# Patient Record
Sex: Male | Born: 1970 | Race: White | Hispanic: No | Marital: Single | Smoking: Never smoker
Health system: Southern US, Community
[De-identification: ages and names within clinical notes are randomized; demographics above are authoritative.]

---

## 2005-05-26 ENCOUNTER — Emergency Department (HOSPITAL_COMMUNITY): Admission: EM | Admit: 2005-05-26 | Discharge: 2005-05-26 | Payer: Self-pay | Admitting: Emergency Medicine

## 2009-05-30 ENCOUNTER — Emergency Department (HOSPITAL_COMMUNITY): Admission: EM | Admit: 2009-05-30 | Discharge: 2009-05-30 | Payer: Self-pay | Admitting: Family Medicine

## 2016-01-01 DIAGNOSIS — K080 Exfoliation of teeth due to systemic causes: Secondary | ICD-10-CM | POA: Diagnosis not present

## 2016-03-28 DIAGNOSIS — K080 Exfoliation of teeth due to systemic causes: Secondary | ICD-10-CM | POA: Diagnosis not present

## 2016-07-09 DIAGNOSIS — F430 Acute stress reaction: Secondary | ICD-10-CM | POA: Diagnosis not present

## 2016-07-09 DIAGNOSIS — J069 Acute upper respiratory infection, unspecified: Secondary | ICD-10-CM | POA: Diagnosis not present

## 2016-09-12 DIAGNOSIS — L259 Unspecified contact dermatitis, unspecified cause: Secondary | ICD-10-CM | POA: Diagnosis not present

## 2016-09-12 DIAGNOSIS — F4320 Adjustment disorder, unspecified: Secondary | ICD-10-CM | POA: Diagnosis not present

## 2016-12-12 DIAGNOSIS — K080 Exfoliation of teeth due to systemic causes: Secondary | ICD-10-CM | POA: Diagnosis not present

## 2016-12-24 DIAGNOSIS — K080 Exfoliation of teeth due to systemic causes: Secondary | ICD-10-CM | POA: Diagnosis not present

## 2017-01-02 DIAGNOSIS — K080 Exfoliation of teeth due to systemic causes: Secondary | ICD-10-CM | POA: Diagnosis not present

## 2017-02-20 DIAGNOSIS — K080 Exfoliation of teeth due to systemic causes: Secondary | ICD-10-CM | POA: Diagnosis not present

## 2017-03-12 DIAGNOSIS — R509 Fever, unspecified: Secondary | ICD-10-CM | POA: Diagnosis not present

## 2017-03-12 DIAGNOSIS — R21 Rash and other nonspecific skin eruption: Secondary | ICD-10-CM | POA: Diagnosis not present

## 2017-03-14 ENCOUNTER — Inpatient Hospital Stay (HOSPITAL_COMMUNITY)
Admission: EM | Admit: 2017-03-14 | Discharge: 2017-03-16 | DRG: 866 | Disposition: A | Discharge status: Home / Self Care | Payer: Federal, State, Local not specified - PPO | Attending: Internal Medicine | Admitting: Internal Medicine

## 2017-03-14 ENCOUNTER — Encounter (HOSPITAL_COMMUNITY): Payer: Self-pay

## 2017-03-14 DIAGNOSIS — B019 Varicella without complication: Secondary | ICD-10-CM | POA: Diagnosis present

## 2017-03-14 DIAGNOSIS — L299 Pruritus, unspecified: Secondary | ICD-10-CM | POA: Diagnosis not present

## 2017-03-14 DIAGNOSIS — E876 Hypokalemia: Secondary | ICD-10-CM | POA: Diagnosis not present

## 2017-03-14 DIAGNOSIS — A419 Sepsis, unspecified organism: Secondary | ICD-10-CM | POA: Diagnosis not present

## 2017-03-14 DIAGNOSIS — R740 Nonspecific elevation of levels of transaminase and lactic acid dehydrogenase [LDH]: Secondary | ICD-10-CM | POA: Diagnosis not present

## 2017-03-14 DIAGNOSIS — R509 Fever, unspecified: Secondary | ICD-10-CM | POA: Diagnosis present

## 2017-03-14 DIAGNOSIS — R21 Rash and other nonspecific skin eruption: Secondary | ICD-10-CM

## 2017-03-14 DIAGNOSIS — R6510 Systemic inflammatory response syndrome (SIRS) of non-infectious origin without acute organ dysfunction: Secondary | ICD-10-CM | POA: Diagnosis not present

## 2017-03-14 DIAGNOSIS — D72820 Lymphocytosis (symptomatic): Secondary | ICD-10-CM | POA: Diagnosis not present

## 2017-03-14 DIAGNOSIS — D696 Thrombocytopenia, unspecified: Secondary | ICD-10-CM | POA: Diagnosis not present

## 2017-03-14 DIAGNOSIS — E860 Dehydration: Secondary | ICD-10-CM | POA: Diagnosis not present

## 2017-03-14 DIAGNOSIS — B2790 Infectious mononucleosis, unspecified without complication: Secondary | ICD-10-CM | POA: Diagnosis present

## 2017-03-14 DIAGNOSIS — B029 Zoster without complications: Secondary | ICD-10-CM | POA: Diagnosis present

## 2017-03-14 DIAGNOSIS — R7401 Elevation of levels of liver transaminase levels: Secondary | ICD-10-CM | POA: Diagnosis present

## 2017-03-14 DIAGNOSIS — F1099 Alcohol use, unspecified with unspecified alcohol-induced disorder: Secondary | ICD-10-CM | POA: Diagnosis not present

## 2017-03-14 DIAGNOSIS — Z8249 Family history of ischemic heart disease and other diseases of the circulatory system: Secondary | ICD-10-CM | POA: Diagnosis not present

## 2017-03-14 DIAGNOSIS — R894 Abnormal immunological findings in specimens from other organs, systems and tissues: Secondary | ICD-10-CM | POA: Diagnosis present

## 2017-03-14 DIAGNOSIS — B0189 Other varicella complications: Secondary | ICD-10-CM | POA: Diagnosis not present

## 2017-03-14 DIAGNOSIS — R768 Other specified abnormal immunological findings in serum: Secondary | ICD-10-CM | POA: Diagnosis present

## 2017-03-14 LAB — CBC WITH DIFFERENTIAL/PLATELET
BASOS ABS: 0.7 10*3/uL — AB (ref 0.0–0.1)
Basophils Relative: 6 %
EOS ABS: 0 10*3/uL (ref 0.0–0.7)
Eosinophils Relative: 0 %
HCT: 44.3 % (ref 39.0–52.0)
Hemoglobin: 15.5 g/dL (ref 13.0–17.0)
LYMPHS ABS: 6 10*3/uL — AB (ref 0.7–4.0)
Lymphocytes Relative: 55 %
MCH: 30.3 pg (ref 26.0–34.0)
MCHC: 35 g/dL (ref 30.0–36.0)
MCV: 86.7 fL (ref 78.0–100.0)
MONO ABS: 0.7 10*3/uL (ref 0.1–1.0)
Monocytes Relative: 6 %
Neutro Abs: 3.6 10*3/uL (ref 1.7–7.7)
Neutrophils Relative %: 33 %
PLATELETS: 131 10*3/uL — AB (ref 150–400)
RBC: 5.11 MIL/uL (ref 4.22–5.81)
RDW: 12.6 % (ref 11.5–15.5)
WBC: 11 10*3/uL — AB (ref 4.0–10.5)

## 2017-03-14 LAB — URINALYSIS, ROUTINE W REFLEX MICROSCOPIC
BACTERIA UA: NONE SEEN
Bilirubin Urine: NEGATIVE
GLUCOSE, UA: NEGATIVE mg/dL
KETONES UR: 20 mg/dL — AB
Leukocytes, UA: NEGATIVE
NITRITE: NEGATIVE
PROTEIN: 30 mg/dL — AB
Specific Gravity, Urine: 1.011 (ref 1.005–1.030)
Squamous Epithelial / LPF: NONE SEEN
pH: 6 (ref 5.0–8.0)

## 2017-03-14 LAB — HEPATIC FUNCTION PANEL
ALT: 129 U/L — AB (ref 17–63)
AST: 52 U/L — ABNORMAL HIGH (ref 15–41)
Albumin: 3.8 g/dL (ref 3.5–5.0)
Alkaline Phosphatase: 114 U/L (ref 38–126)
BILIRUBIN DIRECT: 0.2 mg/dL (ref 0.1–0.5)
BILIRUBIN INDIRECT: 0.4 mg/dL (ref 0.3–0.9)
Total Bilirubin: 0.6 mg/dL (ref 0.3–1.2)
Total Protein: 6.9 g/dL (ref 6.5–8.1)

## 2017-03-14 LAB — BASIC METABOLIC PANEL
Anion gap: 12 (ref 5–15)
BUN: 7 mg/dL (ref 6–20)
CALCIUM: 8.8 mg/dL — AB (ref 8.9–10.3)
CO2: 25 mmol/L (ref 22–32)
CREATININE: 1.17 mg/dL (ref 0.61–1.24)
Chloride: 96 mmol/L — ABNORMAL LOW (ref 101–111)
GFR calc Af Amer: >60 mL/min (ref >60)
Glucose, Bld: 112 mg/dL — ABNORMAL HIGH (ref 65–99)
Potassium: 3.2 mmol/L — ABNORMAL LOW (ref 3.5–5.1)
SODIUM: 133 mmol/L — AB (ref 135–145)

## 2017-03-14 LAB — RAPID HIV SCREEN (HIV 1/2 AB+AG)
HIV 1/2 ANTIBODIES: NONREACTIVE
HIV-1 P24 Antigen - HIV24: NONREACTIVE

## 2017-03-14 LAB — I-STAT CG4 LACTIC ACID, ED: Lactic Acid, Venous: 1.56 mmol/L (ref 0.5–1.9)

## 2017-03-14 LAB — MAGNESIUM: Magnesium: 1.8 mg/dL (ref 1.7–2.4)

## 2017-03-14 LAB — TROPONIN I

## 2017-03-14 MED ORDER — SODIUM CHLORIDE 0.9 % IV BOLUS (SEPSIS)
1000.0000 mL | Freq: Once | INTRAVENOUS | Status: AC
  Administered 2017-03-14: 1000 mL via INTRAVENOUS

## 2017-03-14 MED ORDER — VANCOMYCIN HCL IN DEXTROSE 1-5 GM/200ML-% IV SOLN: 1000.0000 mg | Freq: Three times a day (TID) | INTRAVENOUS | Status: DC

## 2017-03-14 MED ORDER — SODIUM CHLORIDE 0.9 % IV SOLN
1250.0000 mg | Freq: Two times a day (BID) | INTRAVENOUS | Status: DC
  Filled 2017-03-14: qty 1250

## 2017-03-14 MED ORDER — DEXTROSE 5 % IV SOLN
100.0000 mg | Freq: Two times a day (BID) | INTRAVENOUS | Status: DC
  Administered 2017-03-15: 100 mg via INTRAVENOUS
  Filled 2017-03-14 (×2): qty 100

## 2017-03-14 MED ORDER — ACETAMINOPHEN 325 MG PO TABS
325.0000 mg | ORAL_TABLET | Freq: Once | ORAL | Status: DC
  Filled 2017-03-14: qty 1

## 2017-03-14 MED ORDER — ACETAMINOPHEN 500 MG PO TABS
1000.0000 mg | ORAL_TABLET | Freq: Once | ORAL | Status: AC
  Administered 2017-03-14: 1000 mg via ORAL
  Filled 2017-03-14: qty 2

## 2017-03-14 MED ORDER — VANCOMYCIN HCL IN DEXTROSE 1-5 GM/200ML-% IV SOLN
1000.0000 mg | Freq: Once | INTRAVENOUS | Status: AC
  Administered 2017-03-14: 1000 mg via INTRAVENOUS
  Filled 2017-03-14: qty 200

## 2017-03-14 MED ORDER — VANCOMYCIN HCL IN DEXTROSE 1-5 GM/200ML-% IV SOLN
1000.0000 mg | Freq: Once | INTRAVENOUS | Status: DC
  Filled 2017-03-14: qty 200

## 2017-03-14 MED ORDER — POTASSIUM CHLORIDE CRYS ER 20 MEQ PO TBCR
40.0000 meq | EXTENDED_RELEASE_TABLET | ORAL | Status: AC
  Administered 2017-03-14 (×2): 40 meq via ORAL
  Filled 2017-03-14 (×2): qty 2

## 2017-03-14 MED ORDER — DOXYCYCLINE HYCLATE 100 MG IV SOLR
200.0000 mg | Freq: Once | INTRAVENOUS | Status: AC
  Administered 2017-03-14: 200 mg via INTRAVENOUS
  Filled 2017-03-14: qty 200

## 2017-03-14 MED ORDER — PIPERACILLIN-TAZOBACTAM 3.375 G IVPB 30 MIN
3.3750 g | Freq: Once | INTRAVENOUS | Status: AC
  Administered 2017-03-14: 3.375 g via INTRAVENOUS
  Filled 2017-03-14: qty 50

## 2017-03-14 MED ORDER — SODIUM CHLORIDE 0.9 % IV SOLN
INTRAVENOUS | Status: DC
  Administered 2017-03-14 – 2017-03-16 (×4): via INTRAVENOUS

## 2017-03-14 MED ORDER — PIPERACILLIN-TAZOBACTAM 3.375 G IVPB: 3.3750 g | Freq: Three times a day (TID) | INTRAVENOUS | Status: DC

## 2017-03-14 NOTE — ED Provider Notes (Signed)
[facility]-EMERGENCY DEPT Provider Note   CSN: [id number] Arrival date & time: 03/14/17  1147     History   Chief Complaint Chief Complaint  Patient presents with  . Rash    HPI Matthew Logan is a 46 y.o. male presents to the ED with sudden onset, gradually worsening macular papular, pustular generalized rash to entire body 5 days associated with low-grade fever, headache, diarrhea, decreased appetite and fatigue. Rashes mildly pruritic.  [place] during the time his rash started. Patient using calamine lotion, Tylenol, Benadryl with no relief. Patient evaluated by primary care provider and tested negative for varicella, he was referred to ED for work up. No history of chickenpox. Patient denies sexual activity, last over a year ago. Has never been tested for HIV or syphilis. No recent exposure to new topical agents. No new medications. No recent exposure to bath tubs, pools, swimming.  He reports he stayed in a forced her hotel with his friend the whole time, friend does not have any symptoms. No facial or oral angioedema.  No known tick/outdoor exposure.  HPI  History reviewed. No pertinent past medical history.  There are no active problems to display for this patient.   History reviewed. No pertinent surgical history.     Home Medications    Prior to Admission medications   Not on File    Family History No family history on file.  Social History Social History  Substance Use Topics  . Smoking status: Never Smoker  . Smokeless tobacco: Never Used  . Alcohol use Yes     Allergies   Patient has no allergy information on record.   Review of Systems Review of Systems  Constitutional: Positive for appetite change, fatigue and fever. Negative for chills.  HENT: Positive for mouth sores. Negative for congestion and sore throat.   Eyes: Negative for photophobia and visual disturbance.  Respiratory: Negative for cough, chest tightness and shortness of  breath.   Cardiovascular: Negative for chest pain and palpitations.  Gastrointestinal: Positive for vomiting. Negative for abdominal pain, constipation, diarrhea and nausea.  Genitourinary: Negative for difficulty urinating, discharge, dysuria, frequency, hematuria, penile swelling and scrotal swelling.  Musculoskeletal: Negative for arthralgias, joint swelling and myalgias.  Skin: Positive for color change and rash.  Allergic/Immunologic: Negative for immunocompromised state.  Neurological: Positive for headaches. Negative for syncope, weakness, light-headedness and numbness.     Physical Exam Updated Vital Signs BP 135/86   Pulse (!) 102   Temp 99 F (37.2 C) (Oral)   Resp 16   Ht 6\' 2"  (1.88 m)   Wt 99.8 kg (220 lb)   SpO2 96%   BMI 28.25 kg/m   Physical Exam  Constitutional: He is oriented to person, place, and time. He appears well-developed and well-nourished. No distress.  Febrile, 100.7  HENT:  Head: Normocephalic and atraumatic.  Nose: Nose normal.  Moist mucous membranes Vesicular lesions to posterior oropharynx and top of the tongue  Eyes: Conjunctivae and EOM are normal. Pupils are equal, round, and reactive to light.  Neck: Normal range of motion. Neck supple. No JVD present. No tracheal deviation present.  No cervical adenopathy  Cardiovascular: Regular rhythm, normal heart sounds and intact distal pulses.   No murmur heard. Tachycardia, low 100s  Pulmonary/Chest: Effort normal and breath sounds normal. No respiratory distress. He has no wheezes. He has no rales.  Abdominal: Soft. Bowel sounds are normal. He exhibits no distension. There is no tenderness.  Musculoskeletal: Normal  range of motion. He exhibits no deformity.  Neurological: He is alert and oriented to person, place, and time.  Skin: Skin is warm and dry. Capillary refill takes less than 2 seconds. Rash noted.  Diffuse, non-tender, erythematous, maculopapular, pustular rash covering almost  entire  body most densely at face, back and trunk. Rash also to palms of bilateral hands. Erythematous vesicles to posterior oropharynx and top of the tongue.   Psychiatric: He has a normal mood and affect. His behavior is normal. Judgment and thought content normal.  Nursing note and vitals reviewed.    ED Treatments / Results  Labs (all labs ordered are listed, but only abnormal results are displayed) Labs Reviewed  BASIC METABOLIC PANEL - Abnormal; Notable for the following:       Result Value   Sodium 133 (*)    Potassium 3.2 (*)    Chloride 96 (*)    Glucose, Bld 112 (*)    Calcium 8.8 (*)    All other components within normal limits  CBC WITH DIFFERENTIAL/PLATELET - Abnormal; Notable for the following:    WBC 11.0 (*)    Platelets 131 (*)    Lymphs Abs 6.0 (*)    Basophils Absolute 0.7 (*)    All other components within normal limits  HEPATIC FUNCTION PANEL - Abnormal; Notable for the following:    AST 52 (*)    ALT 129 (*)    All other components within normal limits  CULTURE, BLOOD (ROUTINE X 2)  CULTURE, BLOOD (ROUTINE X 2)  RAPID HIV SCREEN (HIV 1/2 AB+AG)  RPR  LYME DISEASE DNA BY PCR(BORRELIA BURG)  HERPES SIMPLEX VIRUS(HSV) DNA BY PCR  ROCKY MTN SPOTTED FVR ABS PNL(IGG+IGM)  URINALYSIS, ROUTINE W REFLEX MICROSCOPIC  TROPONIN I  I-STAT CG4 LACTIC ACID, ED    EKG  EKG Interpretation None       Radiology No results found.  Procedures Procedures (including critical care time)  Medications Ordered in ED Medications  piperacillin-tazobactam (ZOSYN) IVPB 3.375 g (3.375 g Intravenous New Bag/Given 03/14/17 1723)  vancomycin (VANCOCIN) IVPB 1000 mg/200 mL premix (1,000 mg Intravenous New Bag/Given 03/14/17 1722)  sodium chloride 0.9 % bolus 1,000 mL (not administered)  doxycycline (VIBRAMYCIN) 200 mg in dextrose 5 % 250 mL IVPB (not administered)  acetaminophen (TYLENOL) tablet 325 mg (not administered)  sodium chloride 0.9 % bolus 1,000 mL (1,000 mLs Intravenous  New Bag/Given 03/14/17 1723)     Initial Impression / Assessment and Plan / ED Course  I have reviewed the triage vital signs and the nursing notes.  Pertinent labs & imaging results that were available during my care of the patient were reviewed by me and considered in my medical decision making (see chart for details).  Clinical Course as of Apr 07 1231  Fri Mar 14, 2017  1632 Temp 100.7 oral, HR 110  [CG]    Clinical Course User Index [CG] Liberty HandyGibbons, Claudia J, PA-C    46 year old male presents to the ED with slightly pruritic, nontender, erythematous, maculopapular, pustular generalized rash covering most of his body including palms of hands, oropharynx, penis and testicles 5 days associated with fever, fatigue, decreased appetite, diarrhea. On exam patient is febrile and tachycardic, hemodynamically stable. Noncontributory physical exam other than rash noted. No facial or oral angioedema.  No respiratory compromise.  No known recent exposure to possible allergen.  No new topical hygiene products used.  No new medications.  No evidence of animal bite wounds or exposure to ticks/outdoors.  Considering EM, SJS, TEN or meningococcemia although low suspicion for this.  Highly considering HIV, Lyme, RMSF.  Sepsis code activated.  ED lab work so far is remarkable for thrombcytopenia, hypokalemia with potassium 3.2, only mildly elevated ALT 129. Patient was started on IVFs, broad spectrum antibiotics and doxycycline.  Will request admission. Patient and mother at bedside agreeable. Pending RPR, [facility] spotted fever test, Lyme test, HSV test, EKG, troponin, lactic acid, urinalysis and blood cultures.  Patient, ED treatment and discharge plan was discussed with supervising physician who also evaluated the patient and is agreeable with plan.  Final Clinical Impressions(s) / ED Diagnoses   Final diagnoses:  Rash  Sepsis, due to unspecified organism [facility])    New Prescriptions New  Prescriptions   No medications on file     Jerrell Mylar 03/14/17 1718    Liberty Handy, PA-C 03/14/17 1729    Liberty Handy, PA-C 04/06/17 1232    Margarita Grizzle, MD 04/10/17 [phone]

## 2017-03-14 NOTE — Progress Notes (Addendum)
Pharmacy Antibiotic Note  Matthew Logan is a 46 y.o. male admitted on 03/14/2017 with sepsis.  Pharmacy has been consulted for vancomycin and zosyn dosing. Patient also receiving doxycycline per MD for possible tick-borne illness. Patient afebrile, WBC elevated at 11, and LA 1.56. SCr 1.17 for normalized estimated CrCl ~ 80 mL/min.   Plan: Vancomycin 2g IV x1, then 1250 mg IV q12hr  Zosyn 3.375g IV q8hr [facility] and as needed (goal 15-6520mcg/mL) Monitor renal function, clinical picture, and culture data F/u length of therapy   Height: 6\' 2"  (188 cm) Weight: 220 lb (99.8 kg) IBW/kg (Calculated) : 82.2  Temp (24hrs), Avg:99 F (37.2 C), Min:99 F (37.2 C), Max:99 F (37.2 C)   Recent Labs Lab 03/14/17 1314  WBC 11.0*  CREATININE 1.17    Estimated Creatinine Clearance: 102.7 mL/min (by C-G formula based on SCr of 1.17 mg/dL).    Not on File  Antimicrobials this admission: 6/15 Vanc >>  6/15 Zosyn >>  6/15 Doxy x1   Dose adjustments this admission: n/a  Microbiology results: pending   York CeriseKatherine Cook, PharmD Pharmacy Resident  Pager [phone] 03/14/17 5:33 PM

## 2017-03-14 NOTE — H&P (Signed)
History and Physical    NIEVES BARBERI ZOX:[id number] DOB: May 25, 1971 DOA: 03/14/2017  PCP: Patient, No Pcp Per Patient coming from: home  I have personally briefly reviewed patient's old medical records in [facility]  Chief Complaint: Rash  HPI: COLEY KULIKOWSKI is a 46 y.o. male with no significant medical history who presents to ED with a 5 day history of worsening diffuse maculopapular rash. Patient states 5 days prior to admission while at a town in [facility], patient noticed a bump on his nose with some associated weakness decreased appetite and headache. 4 days prior to admission patient flew back and since then up until that of admission patient has noticed a diffuse worsening maculopapular rash on his torso and trunk lower extremities Palms and face. Patient denied any chest pain, no shortness of breath, no abdominal pain, no dysuria, no penile discharge, no constipation, no melena, no hematemesis, no hematochezia, no cough. Patient does endorse chills, fever with a temp as high as 102, nausea. Patient denies any recent new medications. No recent sexual activity. No exposure to hot tubs, pools, swimming. No recent tick bites. Patient states he saw his PCP was given Benadryl and Tylenol and checked for varicella which was negative. Patient states with worsening symptoms PCP office directly patient to the ED.  ED Course: Patient seen in the ED initially noted to have a temperature of 102.9, tachycardic with heart rate of 118, tachypneic with a respiratory rate of 31. comprehensive metabolic profile obtained at a sodium of 133 potassium of 3.2 chloride of 96 calcium of 8.8 AST of 52 ALT of 129 as well as is within normal limits. Lactic acid level is 1.56. CBC had a white count of 11 platelet of 131 absolute lymphocyte count of 6 absolute basophils of 0.7 CBC morphology with atypical lymphocytes. Herpes simplex virus, Lyme disease DNA, [place] spotted fever, RPR, blood cultures, rapid HIV  screen was obtained in the ED. Patient was given a dose of IV vancomycin, IV Zosyn, IV doxycycline.  Review of Systems: As per HPI otherwise 10 point review of systems negative.   History reviewed. No pertinent past medical history.  History reviewed. No pertinent surgical history.   reports that he has never smoked. He has never used smokeless tobacco. He reports that he drinks alcohol. He reports that he does not use drugs.  No Known Allergies  Family History  Problem Relation Age of Onset  . Heart failure Father    Mother alive age 69 and healthy. Father deceased age 25 from heart failure.  Prior to Admission medications   Not on File    Physical Exam: Vitals:   03/14/17 1800 03/14/17 1815 03/14/17 1830 03/14/17 1832  BP: 136/79 (!) 142/80 (!) 143/87   Pulse: 98 (!) 102 96   Resp: (!) 26 19 (!) 31   Temp:    (!) 102.9 F (39.4 C)  TempSrc:    Rectal  SpO2: 93% 92% 92%   Weight:      Height:        Constitutional: NAD, calm, comfortable Vitals:   03/14/17 1800 03/14/17 1815 03/14/17 1830 03/14/17 1832  BP: 136/79 (!) 142/80 (!) 143/87   Pulse: 98 (!) 102 96   Resp: (!) 26 19 (!) 31   Temp:    (!) 102.9 F (39.4 C)  TempSrc:    Rectal  SpO2: 93% 92% 92%   Weight:      Height:  Eyes: PERRL, lids and conjunctivae normal ENMT: Mucous membranes are moist. Posterior pharynx With vesicles noted on the hard palate. Normal dentition.  Neck: normal, supple, no masses, no thyromegaly Respiratory: clear to auscultation bilaterally, no wheezing, no crackles. Normal respiratory effort. No accessory muscle use.  Cardiovascular: Regular rate and rhythm, no murmurs / rubs / gallops. No extremity edema. 2+ pedal pulses. No carotid bruits.  Abdomen: no tenderness, no masses palpated. No hepatosplenomegaly. Bowel sounds positive.  Musculoskeletal: no clubbing / cyanosis. No joint deformity upper and lower extremities. Good ROM, no contractures. Normal muscle tone.  Skin:  Diffuse maculopapular rash, erythematous, pustular covering entire body including face, back, trunk, palms with some vesicular regions. Patient also noted to have some areas with a black necrotic center. Neurologic: CN 2-12 grossly intact. Sensation intact, DTR normal. Strength 5/5 in all 4.  Psychiatric: Normal judgment and insight. Alert and oriented x 3. Normal mood.   Labs on Admission: I have personally reviewed following labs and imaging studies  CBC:  Recent Labs Lab 03/14/17 1314  WBC 11.0*  NEUTROABS 3.6  HGB 15.5  HCT 44.3  MCV 86.7  PLT 131*   Basic Metabolic Panel:  Recent Labs Lab 03/14/17 1314 03/14/17 1710  NA 133*  --   K 3.2*  --   CL 96*  --   CO2 25  --   GLUCOSE 112*  --   BUN 7  --   CREATININE 1.17  --   CALCIUM 8.8*  --   MG  --  1.8   GFR: Estimated Creatinine Clearance: 102.7 mL/min (by C-G formula based on SCr of 1.17 mg/dL). Liver Function Tests:  Recent Labs Lab 03/14/17 1314  AST 52*  ALT 129*  ALKPHOS 114  BILITOT 0.6  PROT 6.9  ALBUMIN 3.8   No results for input(s): LIPASE, AMYLASE in the last 168 hours. No results for input(s): AMMONIA in the last 168 hours. Coagulation Profile: No results for input(s): INR, PROTIME in the last 168 hours. Cardiac Enzymes:  Recent Labs Lab 03/14/17 1710  TROPONINI <0.03   BNP (last 3 results) No results for input(s): PROBNP in the last 8760 hours. HbA1C: No results for input(s): HGBA1C in the last 72 hours. CBG: No results for input(s): GLUCAP in the last 168 hours. Lipid Profile: No results for input(s): CHOL, HDL, LDLCALC, TRIG, CHOLHDL, LDLDIRECT in the last 72 hours. Thyroid Function Tests: No results for input(s): TSH, T4TOTAL, FREET4, T3FREE, THYROIDAB in the last 72 hours. Anemia Panel: No results for input(s): VITAMINB12, FOLATE, FERRITIN, TIBC, IRON, RETICCTPCT in the last 72 hours. Urine analysis: No results found for: COLORURINE, APPEARANCEUR, LABSPEC, PHURINE, GLUCOSEU,  HGBUR, BILIRUBINUR, KETONESUR, PROTEINUR, UROBILINOGEN, NITRITE, LEUKOCYTESUR  Radiological Exams on Admission: No results found.  EKG: Independently reviewed. Sinus tachycardia.  Assessment/Plan Principal Problem:   SIRS (systemic inflammatory response syndrome) (HCC) Active Problems:   Rash   Hypokalemia   Transaminitis   Assessment/plan  #1 systemic inflammatory response syndrome/rash Patient on admission meeting criteria for systemic inflammatory response as noted to have a temperature 102.9, pulse of 118, respirations 31 on admission. Patient noted to have a diffuse rash. Urinalysis pending. Blood cultures obtained are pending. Patient also noted to have a transaminitis as well as a atypical lymphocytes noted on CBC. Concern for viral syndrome. Also concern for acute syphilis versus acute viral etiology versus acute HIV versus [facility] fever. Admit patient to MedSurg. Place on IV fluids. Panculture. Check HIV RNA, EBV panel, CMV IgM,  acute hepatitis panel. Will place empirically on IV doxycycline. Consult with ID for further evaluation and management.  #2 hypokalemia Check a magnesium level. Replete.  #3 transaminitis Questionable etiology. Check an acute hepatitis panel. Follow.   DVT prophylaxis: Lovenox Code Status: Full Family Communication: Updated patient. No family at bedside. Disposition Plan: Home when medically stable and workup completed. Consults called: Infectious disease: Dr Luciana Axe  Admission status: Admit to inpatient.   [facility] MD [facility] Pager [phone]  If 7PM-7AM, please contact night-coverage [email or web address] Password [facility]  03/14/2017, 6:54 PM

## 2017-03-14 NOTE — ED Triage Notes (Signed)
Pt reports rash consisting of red pustules and some with scabs over them to his face, chest, abdomen, back and arms, onset Tuesday. He also reports lack of appetite and fever. He states he tested negative for the chicken pox.

## 2017-03-15 ENCOUNTER — Inpatient Hospital Stay (HOSPITAL_COMMUNITY): Payer: Federal, State, Local not specified - PPO

## 2017-03-15 DIAGNOSIS — F1099 Alcohol use, unspecified with unspecified alcohol-induced disorder: Secondary | ICD-10-CM

## 2017-03-15 DIAGNOSIS — R509 Fever, unspecified: Secondary | ICD-10-CM | POA: Diagnosis present

## 2017-03-15 DIAGNOSIS — Z8249 Family history of ischemic heart disease and other diseases of the circulatory system: Secondary | ICD-10-CM

## 2017-03-15 LAB — COMPREHENSIVE METABOLIC PANEL
ALK PHOS: 99 U/L (ref 38–126)
ALT: 98 U/L — ABNORMAL HIGH (ref 17–63)
AST: 40 U/L (ref 15–41)
Albumin: 3.2 g/dL — ABNORMAL LOW (ref 3.5–5.0)
Anion gap: 7 (ref 5–15)
BUN: 7 mg/dL (ref 6–20)
CALCIUM: 8.3 mg/dL — AB (ref 8.9–10.3)
CHLORIDE: 103 mmol/L (ref 101–111)
CO2: 23 mmol/L (ref 22–32)
Creatinine, Ser: 0.93 mg/dL (ref 0.61–1.24)
GFR calc non Af Amer: >60 mL/min (ref >60)
Glucose, Bld: 132 mg/dL — ABNORMAL HIGH (ref 65–99)
Potassium: 3.4 mmol/L — ABNORMAL LOW (ref 3.5–5.1)
SODIUM: 133 mmol/L — AB (ref 135–145)
Total Bilirubin: 0.7 mg/dL (ref 0.3–1.2)
Total Protein: 6.3 g/dL — ABNORMAL LOW (ref 6.5–8.1)

## 2017-03-15 LAB — CBC
HCT: 40.9 % (ref 39.0–52.0)
Hemoglobin: 13.8 g/dL (ref 13.0–17.0)
MCH: 29.7 pg (ref 26.0–34.0)
MCHC: 33.7 g/dL (ref 30.0–36.0)
MCV: 88 fL (ref 78.0–100.0)
PLATELETS: 125 10*3/uL — AB (ref 150–400)
RBC: 4.65 MIL/uL (ref 4.22–5.81)
RDW: 12.9 % (ref 11.5–15.5)
WBC: 10.6 10*3/uL — ABNORMAL HIGH (ref 4.0–10.5)

## 2017-03-15 LAB — RPR: RPR: NONREACTIVE

## 2017-03-15 LAB — TSH: TSH: 1.125 u[IU]/mL (ref 0.350–4.500)

## 2017-03-15 MED ORDER — FLEET ENEMA 7-19 GM/118ML RE ENEM: 1.0000 | ENEMA | Freq: Once | RECTAL | Status: DC | PRN

## 2017-03-15 MED ORDER — ONDANSETRON HCL 4 MG/2ML IJ SOLN: 4.0000 mg | Freq: Four times a day (QID) | INTRAMUSCULAR | Status: DC | PRN

## 2017-03-15 MED ORDER — POTASSIUM CHLORIDE CRYS ER 20 MEQ PO TBCR
40.0000 meq | EXTENDED_RELEASE_TABLET | Freq: Once | ORAL | Status: AC
  Administered 2017-03-15: 40 meq via ORAL
  Filled 2017-03-15: qty 2

## 2017-03-15 MED ORDER — ACETAMINOPHEN 325 MG PO TABS
650.0000 mg | ORAL_TABLET | Freq: Four times a day (QID) | ORAL | Status: DC | PRN
  Administered 2017-03-15 – 2017-03-16 (×2): 650 mg via ORAL
  Filled 2017-03-15 (×2): qty 2

## 2017-03-15 MED ORDER — ACETAMINOPHEN 650 MG RE SUPP: 650.0000 mg | Freq: Four times a day (QID) | RECTAL | Status: DC | PRN

## 2017-03-15 MED ORDER — ONDANSETRON HCL 4 MG PO TABS: 4.0000 mg | ORAL_TABLET | Freq: Four times a day (QID) | ORAL | Status: DC | PRN

## 2017-03-15 MED ORDER — SORBITOL 70 % SOLN: 30.0000 mL | Freq: Every day | Status: DC | PRN

## 2017-03-15 MED ORDER — ENOXAPARIN SODIUM 40 MG/0.4ML ~~LOC~~ SOLN
40.0000 mg | SUBCUTANEOUS | Status: DC
  Administered 2017-03-15 – 2017-03-16 (×2): 40 mg via SUBCUTANEOUS
  Filled 2017-03-15 (×2): qty 0.4

## 2017-03-15 MED ORDER — POLYETHYLENE GLYCOL 3350 17 G PO PACK: 17.0000 g | PACK | Freq: Every day | ORAL | Status: DC | PRN

## 2017-03-15 NOTE — Consult Note (Addendum)
[facility]       Reason for Consult: rash    Referring Physician: Dr. Janee Mornhompson  Principal Problem:   SIRS (systemic inflammatory response syndrome) (HCC) Active Problems:   Rash   Hypokalemia   Transaminitis   . acetaminophen  325 mg Oral Once  . enoxaparin (LOVENOX) injection  40 mg Subcutaneous Q24H  . potassium chloride  40 mEq Oral Once    Recommendations: Continue supportive care Stop doxycycline  I will order varicella titers  Assessment: He has a rash on his face, trunk, extremities in varying stages and no known history of varicella infection concerning for probable primary varicella.  Other etiologies include acute HIV, syphilis (negative).  Rash not c/w RMSF.     Antibiotics: doxycycline  HPI: Matthew Logan is a 46 y.o. male with no significant pmh [place] and developed a diffuse maculopapular rash.  It is diffuse, non confluent and with crusting lesions as above.  Work up has been negative.  He did get varicella titers by his PCP reportedly which were negative. Some pruritis initially, minimal now.  Some headache, fever.  Denies foreign travel, no sexual contact.  Some dehydration on decreased po intake.  WBC 11, platelets mildly decreased. Some elevation of AST and ALT which has improved today.   CXR independently reviewed and no opacity, clear  Review of Systems:  Constitutional: negative for chills and weight loss Respiratory: negative for cough Gastrointestinal: negative for nausea and diarrhea All other systems reviewed and are negative    PMH: no medical problems or medications  Social History  Substance Use Topics  . Smoking status: Never Smoker  . Smokeless tobacco: Never Used  . Alcohol use Yes    Family History  Problem Relation Age of Onset  . Heart failure Father     No Known Allergies  Physical Exam: Constitutional: in no apparent distress and alert  Vitals:   03/14/17 2108  03/15/17 0438  BP: (!) 139/93 138/76  Pulse: 86 100  Resp:    Temp: 97.6 F (36.4 C) 98.6 F (37 C)   EYES: anicteric ENMT: no thrush Cardiovascular: Cor RRR Respiratory: CTA B; normal respiratory effort GI: Bowel sounds are normal, liver is not enlarged, spleen is not enlarged Musculoskeletal: no pedal edema noted Skin: negatives: no rash Hematologic: + bilateral cervical lad, anterior, no axillary lad  Lab Results  Component Value Date   WBC 10.6 (H) 03/15/2017   HGB 13.8 03/15/2017   HCT 40.9 03/15/2017   MCV 88.0 03/15/2017   PLT 125 (L) 03/15/2017    Lab Results  Component Value Date   CREATININE 0.93 03/15/2017   BUN 7 03/15/2017   NA 133 (L) 03/15/2017   K 3.4 (L) 03/15/2017   CL 103 03/15/2017   CO2 23 03/15/2017    Lab Results  Component Value Date   ALT 98 (H) 03/15/2017   AST 40 03/15/2017   ALKPHOS 99 03/15/2017     Microbiology: Recent Results (from the past 240 hour(s))  Blood Culture (routine x 2)     Status: None (Preliminary result)   Collection Time: 03/14/17  4:55 PM  Result Value Ref Range Status   Specimen Description BLOOD RIGHT ANTECUBITAL  Final   Special Requests   Final    BOTTLES DRAWN AEROBIC AND ANAEROBIC Blood Culture adequate volume   Culture NO GROWTH < 24 HOURS  Final   Report Status PENDING  Incomplete  Blood  Culture (routine x 2)     Status: None (Preliminary result)   Collection Time: 03/14/17  5:10 PM  Result Value Ref Range Status   Specimen Description BLOOD LEFT HAND  Final   Special Requests   Final    BOTTLES DRAWN AEROBIC AND ANAEROBIC Blood Culture adequate volume   Culture NO GROWTH < 24 HOURS  Final   Report Status PENDING  Incomplete    Staci Righter, MD [facility] [email or web address] [phone] pager  [phone] cell 03/15/2017, 1:56 PM

## 2017-03-15 NOTE — Plan of Care (Signed)
PT Screen  Patient Details Name: Matthew HaiJoseph A Axtell MRN: [id number] DOB: 1971-03-02   Screen: Patient reports any weakness that he was feeling had resolved. At baseline he had no mobility issues. He reports no mobility issues at this time. He stood and walked around the room without difficulty. Per nursing he has been getting to the bathroom on his own. His strength is 5/5. The patient has no need for skilled therapy at this time.          Dessie Comaavid J Traver Meckes PT DPT  03/15/2017, 12:35 PM

## 2017-03-15 NOTE — Progress Notes (Signed)
PROGRESS NOTE    Matthew PEMBLETON  FGH:[id number] DOB: 04-12-1971 DOA: 03/14/2017 PCP: Patient, No Pcp Per    Brief Narrative:  Patient is a 46 year old gentleman who presented with the ED with a 5 day history of worsening diffuse rash noted to be in various stages of healing after coming back from last vagus. Patient has been pancultured. Patient also noted to have a fever with a systemic inflammatory response syndrome. Workup underway. ID consulted.   Assessment & Plan:   Principal Problem:   SIRS (systemic inflammatory response syndrome) (HCC) Active Problems:   Rash   Hypokalemia   Transaminitis  #1 systemic inflammatory response syndrome Patient met criteria for systemic inflammatory response syndrome on admission with a temperature 102.9, tachycardia, tachypnea. Patient noted to have a diffuse rash with various stages of healing. Patient pancultured with results pending. Patient noted to have a transaminitis with atypical lymphocytes on CBC. LFTs trending down. Continue IV fluids, supportive care. Patient has been seen in consultation by ID who has ordered some varicella titers. Labs drawn on admission of EBV, CMV, HIV 1 RNA pending. RPR is nonreactive. IV doxycycline has been discontinued per ID recommendations. ID following and appreciate input and recommendations.  #2 rash Questionable etiology. Likely a viral etiology. Patient with diffuse rash in different stages of healing. Patient has been pancultured. [facility] spotted fever, EBV titers, CMV IgM pending, HIV 1 RNA pending, acute hepatitis panel pending. Varicella-zoster antibodies have been ordered per ID. IV antibiotics of doxycycline has been discontinued per ID. IV fluids. Supportive care. Appreciate ID input and recommendations.  #3 hypokalemia Replete.  #4 transaminitis Questionable etiology. Could be secondary to acute illness. LFTs trending down. Acute hepatitis panel pending. Follow.   DVT prophylaxis:  Lovenox Code Status: Full Family Communication: Updated patient and family at bedside. Disposition Plan: Home when clinically improved and labs have resulted with an etiology for patient's rash.   Consultants:   ID: Dr.Comer  Procedures:   Chest x-ray 03/15/2017  Antimicrobials:   IV doxycycline 03/15/2015>>>> 03/16/2015   Subjective: Patient states he's feeling much better than he did on admission. No nausea or emesis. No chest. No shortness of breath. Eating lunch.  Objective: Vitals:   03/14/17 2030 03/14/17 2108 03/15/17 0438 03/15/17 1511  BP: (!) 145/88 (!) 139/93 138/76 (!) 141/81  Pulse: (!) 105 86 100 (!) 108  Resp: (!) 22   16  Temp:  97.6 F (36.4 C) 98.6 F (37 C) 98.8 F (37.1 C)  TempSrc:  Oral Oral Oral  SpO2: 95% 96% 96% 97%  Weight:      Height:        Intake/Output Summary (Last 24 hours) at 03/15/17 1725 Last data filed at 03/15/17 1512  Gross per 24 hour  Intake             3355 ml  Output             1250 ml  Net             2105 ml   Filed Weights   03/14/17 1155  Weight: 99.8 kg (220 lb)    Examination:  General exam: Appears calm and comfortable  Respiratory system: Clear to auscultation. Respiratory effort normal. Cardiovascular system: S1 & S2 heard, RRR. No JVD, murmurs, rubs, gallops or clicks. No pedal edema. Gastrointestinal system: Abdomen is nondistended, soft and nontender. No organomegaly or masses felt. Normal bowel sounds heard. Central nervous system: Alert and oriented. No focal neurological deficits.  Extremities: Symmetric 5 x 5 power. Skin: Rash noted diffusely on trunk face lower extremities Palms in various stages of healing.  Psychiatry: Judgement and insight appear normal. Mood & affect appropriate.     Data Reviewed: I have personally reviewed following labs and imaging studies  CBC:  Recent Labs Lab 03/14/17 1314 03/15/17 0751  WBC 11.0* 10.6*  NEUTROABS 3.6  --   HGB 15.5 13.8  HCT 44.3 40.9  MCV  86.7 88.0  PLT 131* 357*   Basic Metabolic Panel:  Recent Labs Lab 03/14/17 1314 03/14/17 1710 03/15/17 0751  NA 133*  --  133*  K 3.2*  --  3.4*  CL 96*  --  103  CO2 25  --  23  GLUCOSE 112*  --  132*  BUN 7  --  7  CREATININE 1.17  --  0.93  CALCIUM 8.8*  --  8.3*  MG  --  1.8  --    GFR: Estimated Creatinine Clearance: 129.2 mL/min (by C-G formula based on SCr of 0.93 mg/dL). Liver Function Tests:  Recent Labs Lab 03/14/17 1314 03/15/17 0751  AST 52* 40  ALT 129* 98*  ALKPHOS 114 99  BILITOT 0.6 0.7  PROT 6.9 6.3*  ALBUMIN 3.8 3.2*   No results for input(s): LIPASE, AMYLASE in the last 168 hours. No results for input(s): AMMONIA in the last 168 hours. Coagulation Profile: No results for input(s): INR, PROTIME in the last 168 hours. Cardiac Enzymes:  Recent Labs Lab 03/14/17 1710  TROPONINI <0.03   BNP (last 3 results) No results for input(s): PROBNP in the last 8760 hours. HbA1C: No results for input(s): HGBA1C in the last 72 hours. CBG: No results for input(s): GLUCAP in the last 168 hours. Lipid Profile: No results for input(s): CHOL, HDL, LDLCALC, TRIG, CHOLHDL, LDLDIRECT in the last 72 hours. Thyroid Function Tests:  Recent Labs  03/15/17 0751  TSH 1.125   Anemia Panel: No results for input(s): VITAMINB12, FOLATE, FERRITIN, TIBC, IRON, RETICCTPCT in the last 72 hours. Sepsis Labs:  Recent Labs Lab 03/14/17 1722  LATICACIDVEN 1.56    Recent Results (from the past 240 hour(s))  Blood Culture (routine x 2)     Status: None (Preliminary result)   Collection Time: 03/14/17  4:55 PM  Result Value Ref Range Status   Specimen Description BLOOD RIGHT ANTECUBITAL  Final   Special Requests   Final    BOTTLES DRAWN AEROBIC AND ANAEROBIC Blood Culture adequate volume   Culture NO GROWTH < 24 HOURS  Final   Report Status PENDING  Incomplete  Blood Culture (routine x 2)     Status: None (Preliminary result)   Collection Time: 03/14/17  5:10 PM   Result Value Ref Range Status   Specimen Description BLOOD LEFT HAND  Final   Special Requests   Final    BOTTLES DRAWN AEROBIC AND ANAEROBIC Blood Culture adequate volume   Culture NO GROWTH < 24 HOURS  Final   Report Status PENDING  Incomplete         Radiology Studies: Dg Chest 2 View  Result Date: 03/15/2017 CLINICAL DATA:  Fever EXAM: CHEST  2 VIEW COMPARISON:  05/26/2005 FINDINGS: The heart size and mediastinal contours are within normal limits. Both lungs are clear. The visualized skeletal structures are unremarkable. IMPRESSION: No active cardiopulmonary disease. Electronically Signed   By: Franchot Gallo M.D.   On: 03/15/2017 14:22        Scheduled Meds: . acetaminophen  325  mg Oral Once  . enoxaparin (LOVENOX) injection  40 mg Subcutaneous Q24H   Continuous Infusions: . sodium chloride 125 mL/hr at 03/15/17 1019     LOS: 1 day    Time spent: 30 minutes    Canyon Willow, MD [facility] Pager [phone]   If [place], please contact night-coverage [email or web address] Password [email or web address] 03/15/2017, 5:25 PM

## 2017-03-16 DIAGNOSIS — B019 Varicella without complication: Secondary | ICD-10-CM | POA: Diagnosis present

## 2017-03-16 DIAGNOSIS — D72820 Lymphocytosis (symptomatic): Secondary | ICD-10-CM | POA: Diagnosis not present

## 2017-03-16 DIAGNOSIS — R894 Abnormal immunological findings in specimens from other organs, systems and tissues: Secondary | ICD-10-CM

## 2017-03-16 DIAGNOSIS — B0189 Other varicella complications: Secondary | ICD-10-CM

## 2017-03-16 DIAGNOSIS — R768 Other specified abnormal immunological findings in serum: Secondary | ICD-10-CM | POA: Diagnosis present

## 2017-03-16 LAB — EPSTEIN-BARR VIRUS VCA ANTIBODY PANEL
EBV NA IgG: <18 U/mL (ref 0.0–17.9)
EBV VCA IgM: 133 U/mL — ABNORMAL HIGH (ref 0.0–35.9)

## 2017-03-16 LAB — COMPREHENSIVE METABOLIC PANEL
ALK PHOS: 86 U/L (ref 38–126)
ALT: 74 U/L — ABNORMAL HIGH (ref 17–63)
AST: 28 U/L (ref 15–41)
Albumin: 2.9 g/dL — ABNORMAL LOW (ref 3.5–5.0)
Anion gap: 7 (ref 5–15)
BUN: <5 mg/dL — ABNORMAL LOW (ref 6–20)
CALCIUM: 8.3 mg/dL — AB (ref 8.9–10.3)
CHLORIDE: 106 mmol/L (ref 101–111)
CO2: 26 mmol/L (ref 22–32)
Creatinine, Ser: 0.85 mg/dL (ref 0.61–1.24)
Glucose, Bld: 116 mg/dL — ABNORMAL HIGH (ref 65–99)
Potassium: 3.8 mmol/L (ref 3.5–5.1)
SODIUM: 139 mmol/L (ref 135–145)
Total Bilirubin: 0.5 mg/dL (ref 0.3–1.2)
Total Protein: 6.2 g/dL — ABNORMAL LOW (ref 6.5–8.1)

## 2017-03-16 LAB — LYME DISEASE DNA BY PCR(BORRELIA BURG): Lyme Disease(B.burgdorferi)PCR: NEGATIVE

## 2017-03-16 LAB — HIV-1 RNA QUANT-NO REFLEX-BLD
HIV 1 RNA Quant: <20 copies/mL
LOG10 HIV-1 RNA: UNDETERMINED {Log_copies}/mL

## 2017-03-16 LAB — CBC WITH DIFFERENTIAL/PLATELET
BASOS PCT: 2 %
Basophils Absolute: 0.2 10*3/uL — ABNORMAL HIGH (ref 0.0–0.1)
EOS ABS: 0 10*3/uL (ref 0.0–0.7)
Eosinophils Relative: 0 %
HCT: 38.1 % — ABNORMAL LOW (ref 39.0–52.0)
Hemoglobin: 12.7 g/dL — ABNORMAL LOW (ref 13.0–17.0)
Lymphocytes Relative: 56 %
Lymphs Abs: 4.6 10*3/uL — ABNORMAL HIGH (ref 0.7–4.0)
MCH: 29.5 pg (ref 26.0–34.0)
MCHC: 33.3 g/dL (ref 30.0–36.0)
MCV: 88.4 fL (ref 78.0–100.0)
MONO ABS: 0.4 10*3/uL (ref 0.1–1.0)
Monocytes Relative: 5 %
NEUTROS ABS: 3 10*3/uL (ref 1.7–7.7)
Neutrophils Relative %: 37 %
PLATELETS: 151 10*3/uL (ref 150–400)
RBC: 4.31 MIL/uL (ref 4.22–5.81)
RDW: 13 % (ref 11.5–15.5)
WBC: 8.2 10*3/uL (ref 4.0–10.5)

## 2017-03-16 LAB — HERPES SIMPLEX VIRUS(HSV) DNA BY PCR
HSV 1 DNA: NEGATIVE
HSV 2 DNA: NEGATIVE

## 2017-03-16 LAB — URINE CULTURE: CULTURE: NO GROWTH

## 2017-03-16 LAB — CMV IGM: CMV IgM: <30 AU/mL (ref 0.0–29.9)

## 2017-03-16 LAB — VARICELLA ZOSTER ANTIBODY, IGG

## 2017-03-16 MED ORDER — CALAMINE EX LOTN: 1.0000 "application " | TOPICAL_LOTION | CUTANEOUS | 0 refills | Status: AC | PRN

## 2017-03-16 MED ORDER — ONDANSETRON HCL 4 MG PO TABS: 4.0000 mg | ORAL_TABLET | Freq: Four times a day (QID) | ORAL | 0 refills | Status: AC | PRN

## 2017-03-16 NOTE — Progress Notes (Signed)
Discharge instructions printed and reviewed with patient and family, and copy given for them to take home. All questions addressed at this time. New prescriptions reviewed. IV sites (2) removed. Room searched for patient belongings, confirmed with patient that all valuables were accounted for, then staff escorted patient to discharge via wheelchair.

## 2017-03-16 NOTE — Discharge Summary (Signed)
Physician Discharge Summary  Matthew Logan [id number]:[id number] DOB: 07-Jun-1971 DOA: 03/14/2017  PCP: Matthew Neer, MD  Admit date: 03/14/2017 Discharge date: 03/16/2017  Time spent: 65 minutes  Recommendations for Outpatient Follow-up:  1. Follow up with Matthew Neer, MD ion 1 week. On follow-up varicella IgM titers as well as acute hepatitis panel were pending on discharge. Patient will also need a comprehensive metabolic profile done to follow-up on electrolytes renal function and LFTs.   Discharge Diagnoses:  Principal Problem:   SIRS (systemic inflammatory response syndrome) (HCC) Active Problems:   Rash   Varicella: Probable   Hypokalemia   Transaminitis   Fever   EBV seropositivity   Discharge Condition: Stable and improved  Diet recommendation: Regular  Filed Weights   03/14/17 1155  Weight: 99.8 kg (220 lb)    History of present illness:   Matthew Logan is a 46 y.o. male with no significant medical history who presented to ED with a 5 day history of worsening diffuse maculopapular rash. Patient stated 5 days prior to admission while out of town in [facility], patient noticed a bump on his nose with some associated weakness decreased appetite and headache. 4 days prior to admission patient flew back and since then up until that of admission patient has noticed a diffuse worsening maculopapular rash on his torso and trunk lower extremities palms and face. Patient denied any chest pain, no shortness of breath, no abdominal pain, no dysuria, no penile discharge, no constipation, no melena, no hematemesis, no hematochezia, no cough. Patient did endorse chills, fever with a temp as high as 102, nausea. Patient denied any recent new medications. No recent sexual activity. No exposure to hot tubs, pools, swimming. No recent tick bites. Patient stated he saw his PCP was given Benadryl and Tylenol and checked for varicella which was negative. Patient stated with worsening symptoms  PCP office directly patient to the ED.  ED Course: Patient seen in the ED initially noted to have a temperature of 102.9, tachycardic with heart rate of 118, tachypneic with a respiratory rate of 31. comprehensive metabolic profile obtained at a sodium of 133 potassium of 3.2 chloride of 96 calcium of 8.8 AST of 52 ALT of 129 as well as is within normal limits. Lactic acid level is 1.56. CBC had a white count of 11 platelet of 131 absolute lymphocyte count of 6 absolute basophils of 0.7 CBC morphology with atypical lymphocytes. Herpes simplex virus, Lyme disease DNA, [place] spotted fever, RPR, blood cultures, rapid HIV screen was obtained in the ED. Patient was given a dose of IV vancomycin, IV Zosyn, IV doxycycline.  Hospital Course:  #1 systemic inflammatory response syndrome Patient met criteria for systemic inflammatory response syndrome on admission with a temperature 102.9, tachycardia, tachypnea. Patient noted to have a diffuse rash with various stages of healing. Patient pancultured with results pending. Patient noted to have a transaminitis with atypical lymphocytes on CBC. LFTs trended down. Patient was maintained on supportive care with significant clinical improvement. ID was consulted who followed the patient during the hospitalization and felt patient likely had probable varicella-zoster. Varicella IgG was noted to be negative as expected as patient supposedly with no immunity. Parasellar IgM pending at time of discharge. EBV IgM was positive however was followed rash was not typical of EBV. Patient improved clinically with supportive care and was close to baseline by day of discharge.   #2 rash/probable varicella. Questionable etiology. Likely a viral etiology felt to be secondary to  possible varicella-zoster. Maricela titers were obtained however some of the parasellar titers were pending at time of discharge. IgG was negative so no immunity as expected. Varicella-zoster IgM was  pending at the time of discharge which should be elevated and this was an acute varicella-zoster. Patient with diffuse rash in different stages of healing. Patient has been pancultured. [facility] spotted fever, EBV titers with a positive EBV IgM however rash was not typical for an acute EBV although patient did have cervical adenopathy and transaminitis and thrombocytopenia which HER primary EBV per ID. If patient's varicella IgM is negative then rash may have been an unusual manifestation of EBV per ID. CMV IgM was negative. HIV 1 RNA was less than 20. Acute hepatitis panel pending. Patient was initially placed on IV doxycycline which was subsequently discontinued. Patient remained afebrile with a normal white count of antibiotics and improved clinically with supportive care. Outpatient follow-up with PCP.   #3 EBV IgM positive Patient's EBV IgM was positive and per ID rash was not typical for an acute EBV infection although patient did have some cervical adenopathy and transaminitis and thrombocytopenia. It was felt per ID that a very subtle IgM was negative then rash likely an unusual manifestation of EBV.  #4 hypokalemia Repleted.  #5 transaminitis Questionable etiology. Could be secondary to acute illness. LFTs trending down. Acute hepatitis panel pending. EBV IgM positive. LFTs trended down. Outpatient follow-up.  Follow.    Procedures:  Chest x-ray 03/15/2017  Consultations:  ID: Dr.Comer  Discharge Exam: Vitals:   03/16/17 0514 03/16/17 1606  BP: 127/85 (!) 141/88  Pulse: 88 89  Resp:  16  Temp: 98.4 F (36.9 C) 98.1 F (36.7 C)    General: NAD Cardiovascular: RRR Respiratory: CTAB  Discharge Instructions   Discharge Instructions    Diet general    Complete by:  As directed    Increase activity slowly    Complete by:  As directed      Current Discharge Medication List    START taking these medications   Details  calamine lotion Apply 1 application  topically as needed for itching. Qty: 120 mL, Refills: 0    ondansetron (ZOFRAN) 4 MG tablet Take 1 tablet (4 mg total) by mouth every 6 (six) hours as needed for nausea. Qty: 15 tablet, Refills: 0      CONTINUE these medications which have NOT CHANGED   Details  acetaminophen (TYLENOL) 500 MG tablet Take 500 mg by mouth every 6 (six) hours as needed.    diphenhydrAMINE (BENADRYL) 25 MG tablet Take 25 mg by mouth every 6 (six) hours as needed for itching.       No Known Allergies Follow-up Information    Matthew Neer, MD. Schedule an appointment as soon as possible for a visit in 1 week(s).   Specialty:  Family Medicine Contact information: [address] [place] [address] [phone]            The results of significant diagnostics from this hospitalization (including imaging, microbiology, ancillary and laboratory) are listed below for reference.    Significant Diagnostic Studies: Dg Chest 2 View  Result Date: 03/15/2017 CLINICAL DATA:  Fever EXAM: CHEST  2 VIEW COMPARISON:  05/26/2005 FINDINGS: The heart size and mediastinal contours are within normal limits. Both lungs are clear. The visualized skeletal structures are unremarkable. IMPRESSION: No active cardiopulmonary disease. Electronically Signed   By: Franchot Gallo M.D.   On: 03/15/2017 14:22    Microbiology:  Recent Results (from the past 240 hour(s))  Blood Culture (routine x 2)     Status: None (Preliminary result)   Collection Time: 03/14/17  4:55 PM  Result Value Ref Range Status   Specimen Description BLOOD RIGHT ANTECUBITAL  Final   Special Requests   Final    BOTTLES DRAWN AEROBIC AND ANAEROBIC Blood Culture adequate volume   Culture NO GROWTH 2 DAYS  Final   Report Status PENDING  Incomplete  Blood Culture (routine x 2)     Status: None (Preliminary result)   Collection Time: 03/14/17  5:10 PM  Result Value Ref Range Status   Specimen Description BLOOD LEFT HAND  Final    Special Requests   Final    BOTTLES DRAWN AEROBIC AND ANAEROBIC Blood Culture adequate volume   Culture NO GROWTH 2 DAYS  Final   Report Status PENDING  Incomplete  Urine Culture     Status: None   Collection Time: 03/14/17  8:09 PM  Result Value Ref Range Status   Specimen Description URINE, CLEAN CATCH  Final   Special Requests NONE  Final   Culture NO GROWTH  Final   Report Status 03/16/2017 FINAL  Final     Labs: Basic Metabolic Panel:  Recent Labs Lab 03/14/17 1314 03/14/17 1710 03/15/17 0751 03/16/17 0443  NA 133*  --  133* 139  K 3.2*  --  3.4* 3.8  CL 96*  --  103 106  CO2 25  --  23 26  GLUCOSE 112*  --  132* 116*  BUN 7  --  7 <5*  CREATININE 1.17  --  0.93 0.85  CALCIUM 8.8*  --  8.3* 8.3*  MG  --  1.8  --   --    Liver Function Tests:  Recent Labs Lab 03/14/17 1314 03/15/17 0751 03/16/17 0443  AST 52* 40 28  ALT 129* 98* 74*  ALKPHOS 114 99 86  BILITOT 0.6 0.7 0.5  PROT 6.9 6.3* 6.2*  ALBUMIN 3.8 3.2* 2.9*   No results for input(s): LIPASE, AMYLASE in the last 168 hours. No results for input(s): AMMONIA in the last 168 hours. CBC:  Recent Labs Lab 03/14/17 1314 03/15/17 0751 03/16/17 0443  WBC 11.0* 10.6* 8.2  NEUTROABS 3.6  --  3.0  HGB 15.5 13.8 12.7*  HCT 44.3 40.9 38.1*  MCV 86.7 88.0 88.4  PLT 131* 125* 151   Cardiac Enzymes:  Recent Labs Lab 03/14/17 1710  TROPONINI <0.03   BNP: BNP (last 3 results) No results for input(s): BNP in the last 8760 hours.  ProBNP (last 3 results) No results for input(s): PROBNP in the last 8760 hours.  CBG: No results for input(s): GLUCAP in the last 168 hours.     SignedIrine Seal MD.  [facility] 03/16/2017, 4:39 PM

## 2017-03-16 NOTE — Evaluation (Signed)
Occupational Therapy Evaluation and Discharge Patient Details Name: Matthew HaiJoseph A Maxfield MRN: [id number] DOB: [birth year] Today's Date: 03/16/2017    History of Present Illness Patient is a 46 year old gentleman who presented with the ED with a 5 day history of worsening diffuse rash and fever.    Clinical Impression   Pt reports he was independent with ADL and mobility PTA. Pt currently at baseline with ADL and mobility. Reports he is feeling much better today; no weakness and minimal pain with rash on bottoms of feet when walking. Pt planning to d/c home with supervision from family. No acute OT needs identified; signing off at this time. Please re-consult if needs change.     Follow Up Recommendations  No OT follow up    Equipment Recommendations  None recommended by OT    Recommendations for Other Services       Precautions / Restrictions Precautions Precautions: None Restrictions Weight Bearing Restrictions: No      Mobility Bed Mobility Overal bed mobility: Independent                Transfers Overall transfer level: Independent                    Balance Overall balance assessment: No apparent balance deficits (not formally assessed)                                         ADL either performed or assessed with clinical judgement   ADL Overall ADL's : Independent;At baseline                                             Vision         Perception     Praxis      Pertinent Vitals/Pain Pain Assessment: Faces Faces Pain Scale: Hurts a little bit Pain Location: "bumps on bottom of my feet when walking" Pain Descriptors / Indicators: Discomfort Pain Intervention(s): Monitored during session     Hand Dominance     Extremity/Trunk Assessment Upper Extremity Assessment Upper Extremity Assessment: Overall WFL for tasks assessed   Lower Extremity Assessment Lower Extremity Assessment: Overall WFL for tasks  assessed   Cervical / Trunk Assessment Cervical / Trunk Assessment: Normal   Communication Communication Communication: No difficulties   Cognition Arousal/Alertness: Awake/alert Behavior During Therapy: WFL for tasks assessed/performed Overall Cognitive Status: Within Functional Limits for tasks assessed                                     General Comments       Exercises     Shoulder Instructions      Home Living Family/patient expects to be discharged to:: Private residence Living Arrangements: Parent;Other relatives Available Help at Discharge: Family Type of Home: House       Home Layout: One level     Bathroom Shower/Tub: Producer, television/film/videoWalk-in shower   Bathroom Toilet: Standard     Home Equipment: None          Prior Functioning/Environment Level of Independence: Independent                 OT Problem List:  OT Treatment/Interventions:      OT Goals(Current goals can be found in the care plan section) Acute Rehab OT Goals Patient Stated Goal: figure out whats going on OT Goal Formulation: All assessment and education complete, DC therapy  OT Frequency:     Barriers to D/C:            Co-evaluation              AM-PAC PT "6 Clicks" Daily Activity     Outcome Measure Help from another person eating meals?: None Help from another person taking care of personal grooming?: None Help from another person toileting, which includes using toliet, bedpan, or urinal?: None Help from another person bathing (including washing, rinsing, drying)?: None Help from another person to put on and taking off regular upper body clothing?: None Help from another person to put on and taking off regular lower body clothing?: None 6 Click Score: 24   End of Session    Activity Tolerance: Patient tolerated treatment well Patient left: in bed;with call bell/phone within reach  OT Visit Diagnosis: Pain                Time: 8119-1478 OT Time  Calculation (min): 10 min Charges:  OT General Charges $OT Visit: 1 Procedure OT Evaluation $OT Eval Low Complexity: 1 Procedure G-Codes:     Fredric Mare A. Brett Albino, M.S., OTR/L Pager: [phone]  Gaye Alken 03/16/2017, 9:17 AM

## 2017-03-16 NOTE — Progress Notes (Signed)
    [facility]   Reason for visit: Follow up on rash  Interval History: some mild improvement of rash; no fever, WBC now wnl.  Remains off of antiboitics.  Physical Exam: Constitutional:  Vitals:   03/15/17 2106 03/16/17 0514  BP: (!) 154/97 127/85  Pulse: (!) 109 88  Resp:    Temp: 98.4 F (36.9 C) 98.4 F (36.9 C)   patient appears in NAD HENT: diffuse rash on face, some increased dry areas and less purulence overall Respiratory: Normal respiratory effort; CTA B Cardiovascular: RRR GI: soft, nt, nd  Review of Systems: Constitutional: negative for fevers and chills Gastrointestinal: negative for diarrhea  Lab Results  Component Value Date   WBC 8.2 03/16/2017   HGB 12.7 (L) 03/16/2017   HCT 38.1 (L) 03/16/2017   MCV 88.4 03/16/2017   PLT 151 03/16/2017    Lab Results  Component Value Date   CREATININE 0.85 03/16/2017   BUN <5 (L) 03/16/2017   NA 139 03/16/2017   K 3.8 03/16/2017   CL 106 03/16/2017   CO2 26 03/16/2017    Lab Results  Component Value Date   ALT 74 (H) 03/16/2017   AST 28 03/16/2017   ALKPHOS 86 03/16/2017     Microbiology: Recent Results (from the past 240 hour(s))  Blood Culture (routine x 2)     Status: None (Preliminary result)   Collection Time: 03/14/17  4:55 PM  Result Value Ref Range Status   Specimen Description BLOOD RIGHT ANTECUBITAL  Final   Special Requests   Final    BOTTLES DRAWN AEROBIC AND ANAEROBIC Blood Culture adequate volume   Culture NO GROWTH 2 DAYS  Final   Report Status PENDING  Incomplete  Blood Culture (routine x 2)     Status: None (Preliminary result)   Collection Time: 03/14/17  5:10 PM  Result Value Ref Range Status   Specimen Description BLOOD LEFT HAND  Final   Special Requests   Final    BOTTLES DRAWN AEROBIC AND ANAEROBIC Blood Culture adequate volume   Culture NO GROWTH 2 DAYS  Final   Report Status PENDING  Incomplete  Urine Culture     Status: None   Collection Time:  03/14/17  8:09 PM  Result Value Ref Range Status   Specimen Description URINE, CLEAN CATCH  Final   Special Requests NONE  Final   Culture NO GROWTH  Final   Report Status 03/16/2017 FINAL  Final    Impression/Plan:  1. Possible varicella - IgG negative so no immunity as expected.  Still waiting for IgM which would be expected to be elevated if this is acute varicella.   Continue supportive care  2. EBV IgM positive - this rash is not typical of acute EBV though he has had the cervical adenopathy and did have transaminitis, thrombocytopenia which occurs with primary EBV.  If varicella IgM is negative, the rash may have been an unusual manifestation of EBV.  Regardless, no treatment indicated.  3. Rash - as above.  He will need to stay out of work until lesions dried, crusted (anticipate 2-3 days)    Ok from an ID standpoint for discharge.    I will sign off, thanks

## 2017-03-17 LAB — HERPES SIMPLEX VIRUS(HSV) DNA BY PCR

## 2017-03-17 LAB — HEPATITIS PANEL, ACUTE
Hep A IgM: NEGATIVE
Hep B C IgM: NEGATIVE
Hepatitis B Surface Ag: NEGATIVE

## 2017-03-17 LAB — VARICELLA ZOSTER ANTIBODY, IGM: Varicella-Zoster Ab, IgM: 4.44 index — ABNORMAL HIGH (ref 0.00–0.90)

## 2017-03-18 LAB — ROCKY MTN SPOTTED FVR ABS PNL(IGG+IGM)
RMSF IGM: 0.25 {index} (ref 0.00–0.89)
RMSF IgG: NEGATIVE

## 2017-03-18 LAB — PATHOLOGIST SMEAR REVIEW

## 2017-03-19 LAB — CULTURE, BLOOD (ROUTINE X 2)
CULTURE: NO GROWTH
CULTURE: NO GROWTH
SPECIAL REQUESTS: ADEQUATE
Special Requests: ADEQUATE

## 2017-03-26 DIAGNOSIS — B019 Varicella without complication: Secondary | ICD-10-CM | POA: Diagnosis not present

## 2017-08-28 DIAGNOSIS — J069 Acute upper respiratory infection, unspecified: Secondary | ICD-10-CM | POA: Diagnosis not present

## 2017-11-24 IMAGING — DX DG CHEST 2V
2 series · 2 of 2 positions shown · non-contrast
Comparison: 05/26/2005

CLINICAL DATA: Fever

EXAM:
CHEST  2 VIEW

[chest pa]
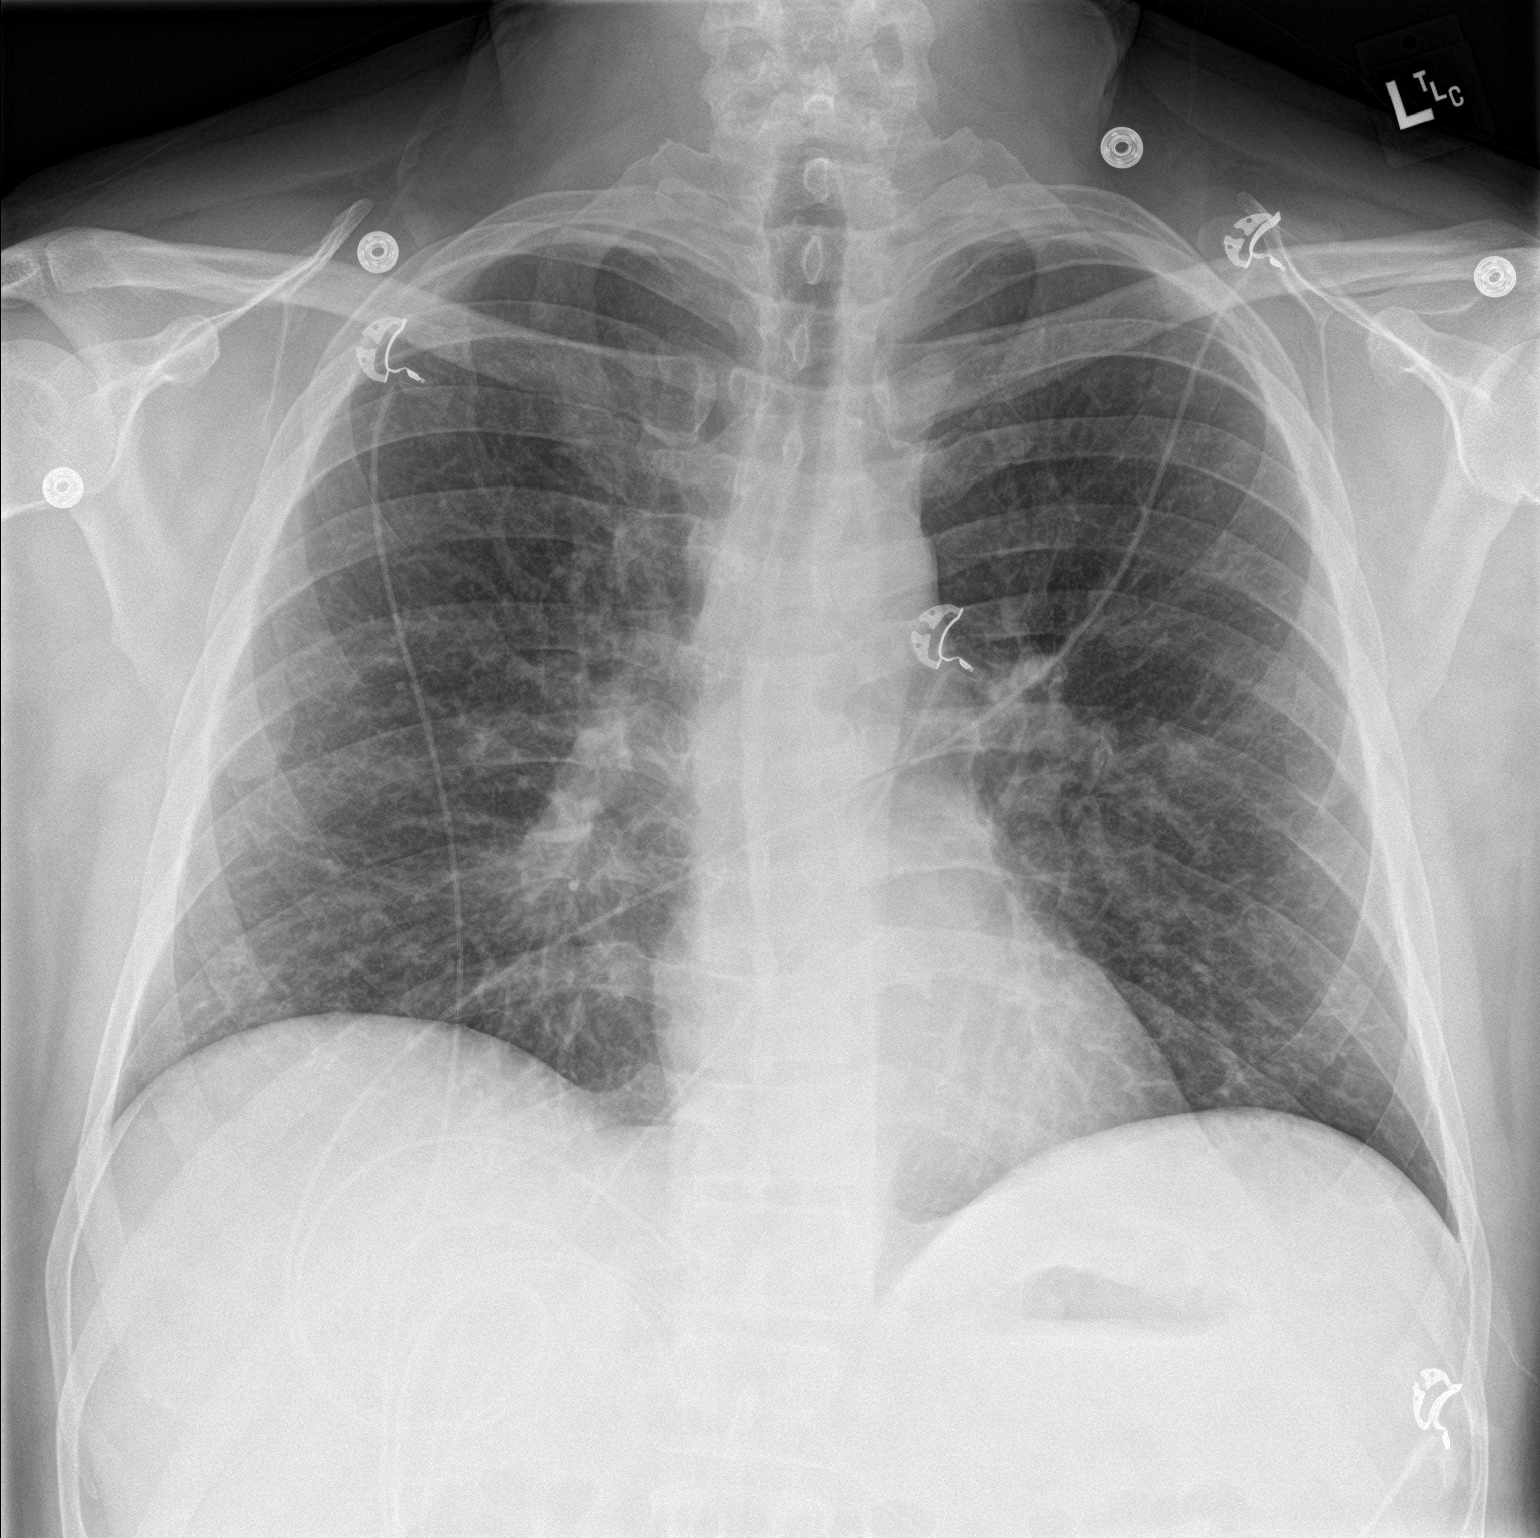

[chest lat]
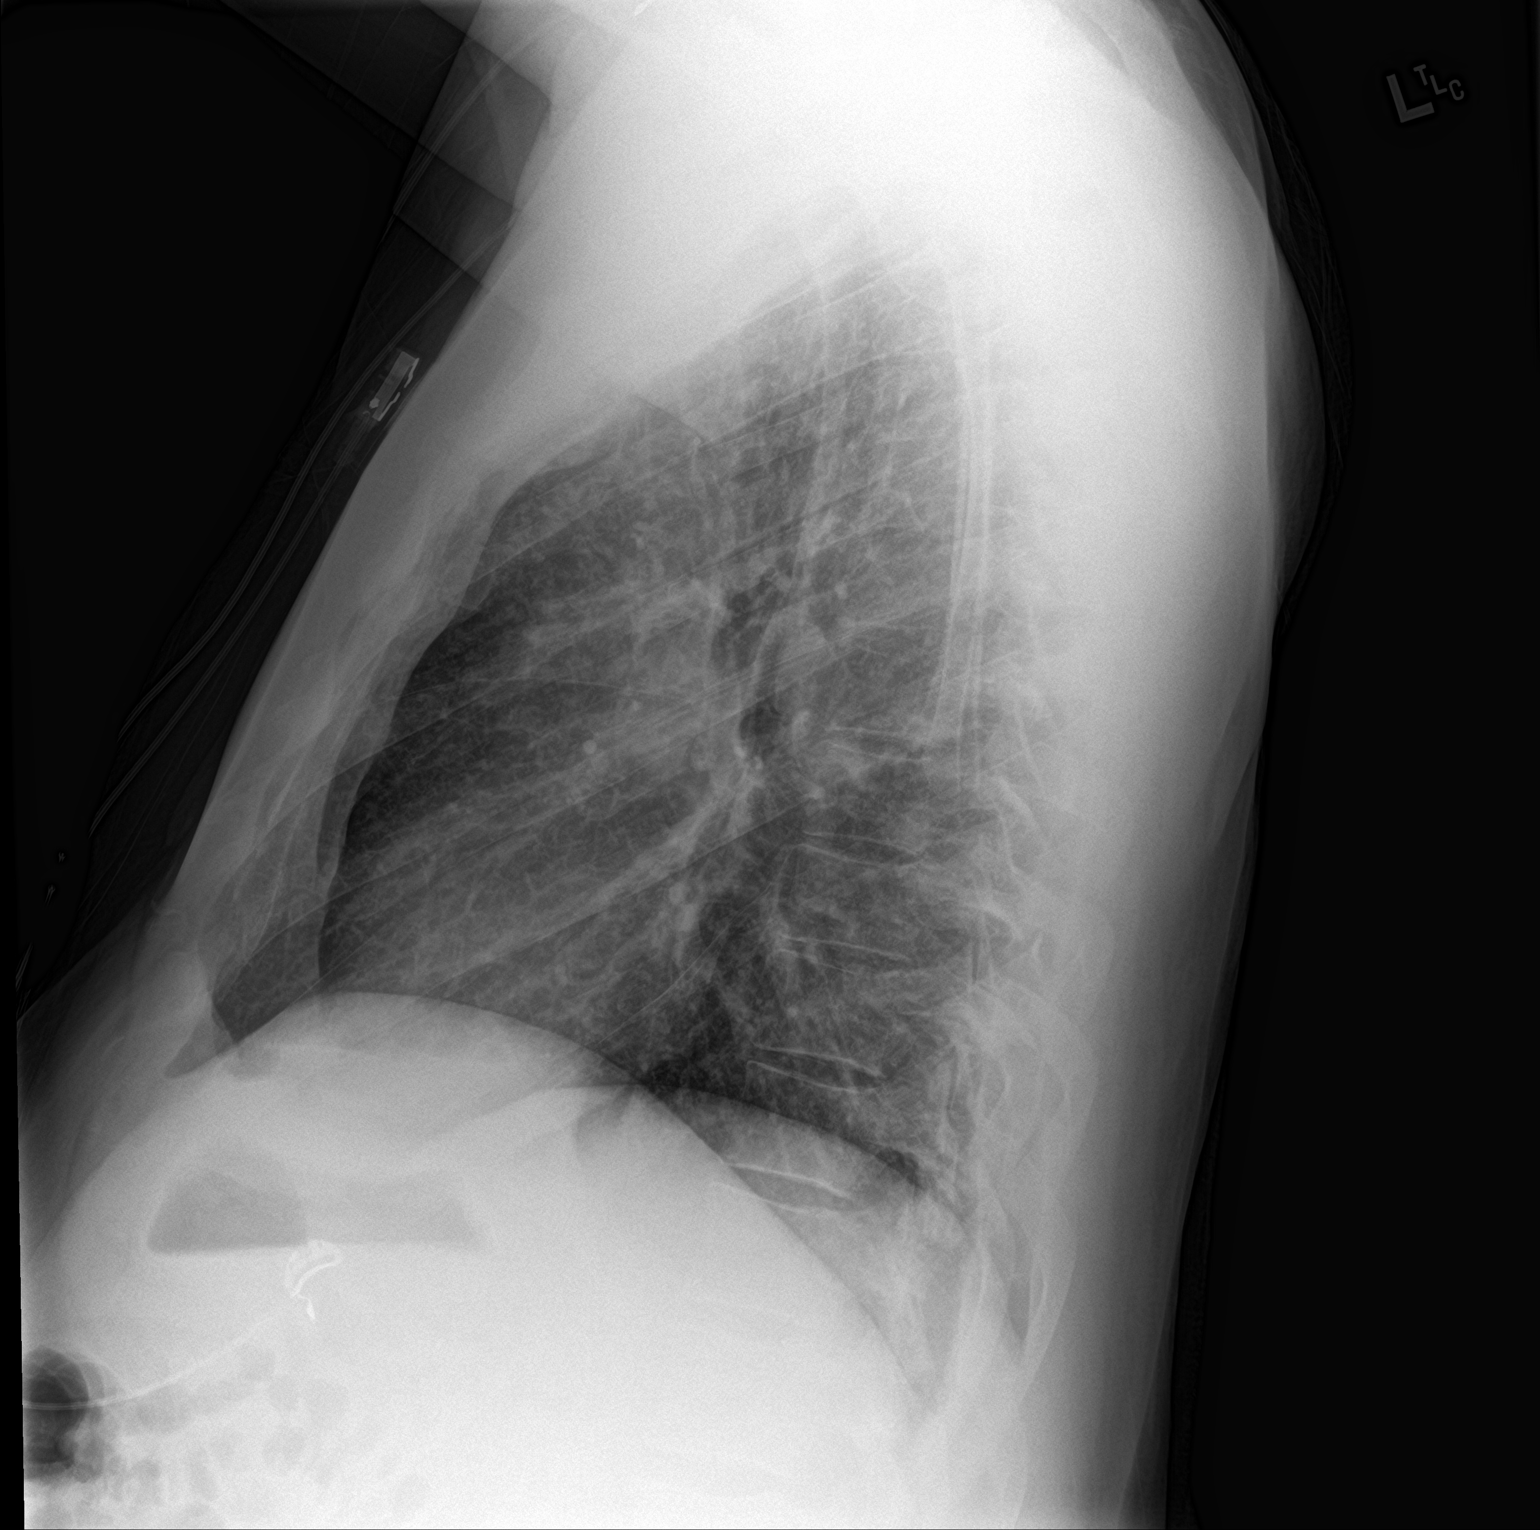

[2 of 2 positions shown; findings below may reference images not displayed]

FINDINGS: The heart size and mediastinal contours are within normal limits.
Both lungs are clear. The visualized skeletal structures are
unremarkable.
IMPRESSION: No active cardiopulmonary disease.
